# Patient Record
Sex: Male | Born: 1980 | Race: White | Hispanic: No | Marital: Married | Smoking: Never smoker
Health system: Southern US, Community
[De-identification: ages and names within clinical notes are randomized; demographics above are authoritative.]

## PROBLEM LIST (undated history)

## (undated) DIAGNOSIS — B019 Varicella without complication: Secondary | ICD-10-CM

## (undated) HISTORY — DX: Varicella without complication: B01.9

---

## 2012-08-08 HISTORY — PX: CERVICAL FUSION: SHX112

## 2014-05-26 ENCOUNTER — Ambulatory Visit: Payer: Self-pay | Admitting: Orthopedic Surgery

## 2014-11-14 ENCOUNTER — Ambulatory Visit
Admit: 2014-11-14 | Disposition: A | Discharge status: Home / Self Care | Payer: Self-pay | Attending: Neurosurgery | Admitting: Neurosurgery

## 2016-01-06 ENCOUNTER — Ambulatory Visit (INDEPENDENT_AMBULATORY_CARE_PROVIDER_SITE_OTHER): Payer: 59 | Admitting: Family Medicine

## 2016-01-06 ENCOUNTER — Ambulatory Visit (INDEPENDENT_AMBULATORY_CARE_PROVIDER_SITE_OTHER)
Admission: RE | Admit: 2016-01-06 | Discharge: 2016-01-06 | Disposition: A | Discharge status: Home / Self Care | Payer: 59 | Source: Ambulatory Visit | Attending: Family Medicine | Admitting: Family Medicine

## 2016-01-06 ENCOUNTER — Encounter: Payer: Self-pay | Admitting: Family Medicine

## 2016-01-06 VITALS — BP 104/76 | HR 66 | Temp 98.3°F | Ht 66.25 in | Wt 175.1 lb

## 2016-01-06 DIAGNOSIS — R208 Other disturbances of skin sensation: Secondary | ICD-10-CM

## 2016-01-06 DIAGNOSIS — M546 Pain in thoracic spine: Secondary | ICD-10-CM | POA: Insufficient documentation

## 2016-01-06 DIAGNOSIS — L659 Nonscarring hair loss, unspecified: Secondary | ICD-10-CM | POA: Diagnosis not present

## 2016-01-06 DIAGNOSIS — R200 Anesthesia of skin: Secondary | ICD-10-CM

## 2016-01-06 MED ORDER — DICLOFENAC SODIUM 75 MG PO TBEC: 75.0000 mg | DELAYED_RELEASE_TABLET | Freq: Two times a day (BID) | ORAL | Status: AC

## 2016-01-06 NOTE — Assessment & Plan Note (Signed)
New problem. Likely MSK in etiology. Obtaining xray.  Treating with Voltaren.

## 2016-01-06 NOTE — Assessment & Plan Note (Signed)
New problem. Alopecia barbae. Sending to derm for treatment.

## 2016-01-06 NOTE — Patient Instructions (Signed)
Take the medication as needed for your pain.  We will call with the xray results as well as with the referral to dermatology.  Follow up annually or sooner if needed (i.e if pain persists).  Wouldn't be a bad idea to see the surgeon.  Take care  Dr. Adriana Simasook

## 2016-01-06 NOTE — Progress Notes (Signed)
Pre visit review using our clinic review tool, if applicable. No additional management support is needed unless otherwise documented below in the visit note. 

## 2016-01-06 NOTE — Progress Notes (Signed)
Subjective:  Patient ID: Greg Doyle, male    DOB: Dec 08, 1980  Age: 35 y.o. MRN: [id number]  CC: Hair loss, beard; Back pain  HPI Greg Doyle is a 35 y.o. male presents to the clinic today with the above complaints.  Hair loss  Patient reports approximately 3 months ago he had a single circular area of hair loss is beard.  He states that since that time he's had an additional area pop up.  He states that he is concerned that this is worsening or spreading.  No associated itching, redness, irritation.  No new stressors or exposures.  No known exacerbating or relieving factors.  Back pain  Patient reports the past 3-4 months she's had a discrete area of back pain.  He states it is been worsening over the past 3-4 days.  Located in the mid back.  Pain is directly over the spinous process.  Worsens with certain activities.  No known relieving factors.  No medications or interventions tried.  He reports that he's had numbness of his index finger which he correlates with this back pain. Of note, he has had cervical fusion.  No recent fall, trauma, injury.  PMH, Surgical Hx, Family Hx, Social History reviewed and updated as below.  Past Medical History  Diagnosis Date  . Chicken pox    Past Surgical History  Procedure Laterality Date  . Cervical fusion  2014    C6-C7   History reviewed. No pertinent family history. Social History  Substance Use Topics  . Smoking status: Never Smoker   . Smokeless tobacco: Never Used  . Alcohol Use: 0.0 - 0.6 oz/week    0-1 Standard drinks or equivalent per week   Review of Systems  Musculoskeletal: Positive for back pain.  Skin:       Hair loss - beard.  Neurological:       Index finger (R) numbness.  All other systems reviewed and are negative.  Objective:   Today's Vitals: BP 104/76 mmHg  Pulse 66  Temp(Src) 98.3 F (36.8 C) (Oral)  Ht 5' 6.25" (1.683 m)  Wt 175 lb 2 oz (79.436 kg)  BMI 28.04  kg/m2  SpO2 98%  Physical Exam  Constitutional: He is oriented to person, place, and time. He appears well-developed. No distress.  HENT:  Head: Normocephalic and atraumatic.  Mouth/Throat: Oropharynx is clear and moist.  Eyes: Conjunctivae are normal. No scleral icterus.  Neck: Neck supple.  Cardiovascular: Normal rate and regular rhythm.   Pulmonary/Chest: Effort normal and breath sounds normal.  Abdominal: Soft. He exhibits no distension. There is no tenderness. There is no rebound and no guarding.  Musculoskeletal:  Thoracic spine - discrete area of tenderness over the spinous process of T7.  Lymphadenopathy:    He has no cervical adenopathy.  Neurological: He is alert and oriented to person, place, and time.  Skin:  Beard with 2 focal areas of hair loss (3.5 cm x 3 cm & 1.5 cm circumferentially).  Psychiatric:  Flat affect.  Vitals reviewed.  Assessment & Plan:   Problem List Items Addressed This Visit    Midline thoracic back pain    New problem. Likely MSK in etiology. Obtaining xray.  Treating with Voltaren.      Relevant Medications   diclofenac (VOLTAREN) 75 MG EC tablet   Other Relevant Orders   DG Thoracic Spine 2 View   Alopecia - Primary    New problem. Alopecia barbae. Sending to derm for treatment.  Relevant Orders   Ambulatory referral to Dermatology    Other Visit Diagnoses    Hand numbness        Relevant Orders    DG Cervical Spine Complete       Outpatient Encounter Prescriptions as of 01/06/2016  Medication Sig  . diclofenac (VOLTAREN) 75 MG EC tablet Take 1 tablet (75 mg total) by mouth 2 (two) times daily.   No facility-administered encounter medications on file as of 01/06/2016.    Follow-up: PRN  Everlene OtherJayce Marli Diego DO [facility]

## 2016-01-08 ENCOUNTER — Telehealth: Payer: Self-pay | Admitting: *Deleted

## 2016-01-08 NOTE — Telephone Encounter (Signed)
Patient requested his Xray results

## 2016-01-08 NOTE — Telephone Encounter (Signed)
Informed patient of X-Ray results.

## 2017-11-17 ENCOUNTER — Other Ambulatory Visit: Payer: Self-pay | Admitting: Family Medicine

## 2018-01-02 IMAGING — DX DG CERVICAL SPINE COMPLETE 4+V
5 series · 5 of 5 positions shown · non-contrast
Comparison: November 14, 2014.

CLINICAL DATA: Right finger numbness and tingling.

EXAM:
CERVICAL SPINE - COMPLETE 4+ VIEW

[c-spine lat]
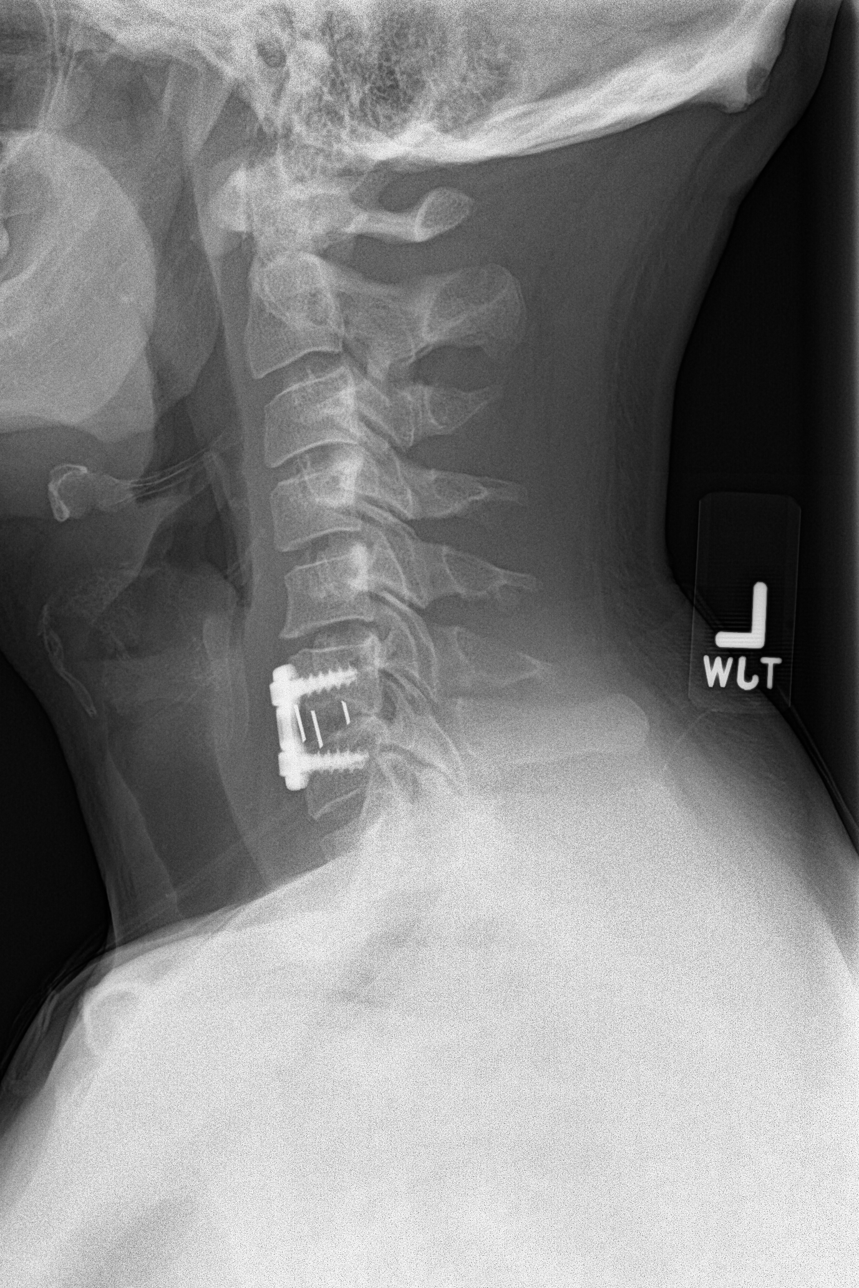

[c-spine obl (1 of 2)]
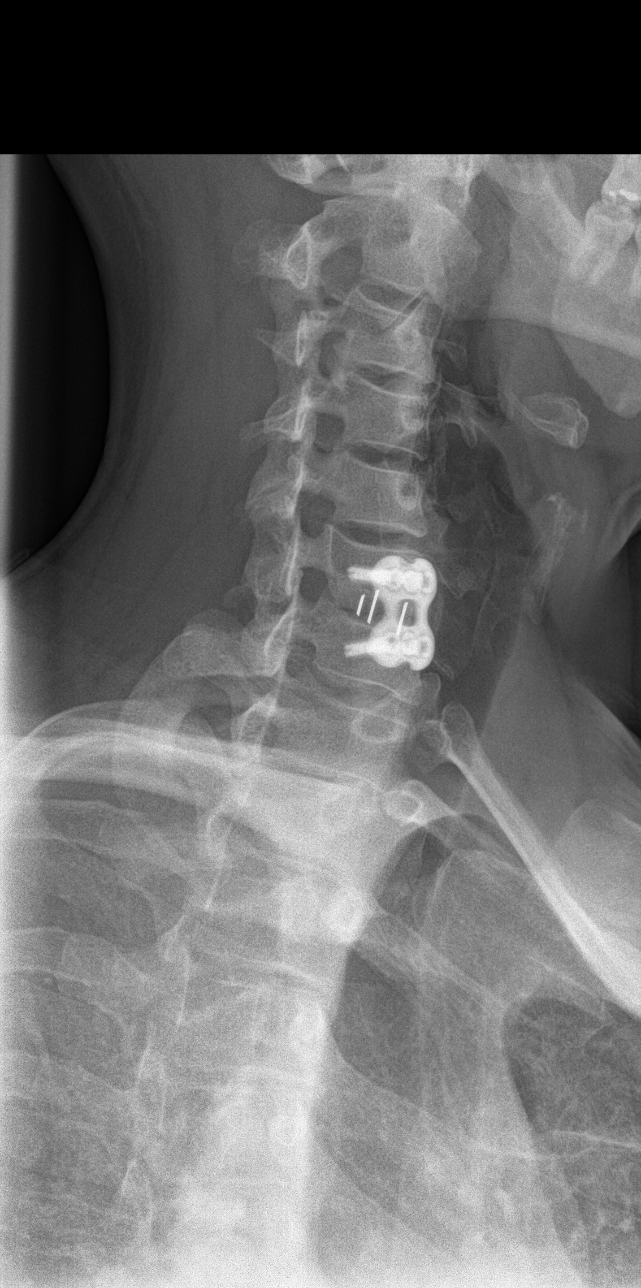

[c-spine obl (2 of 2)]
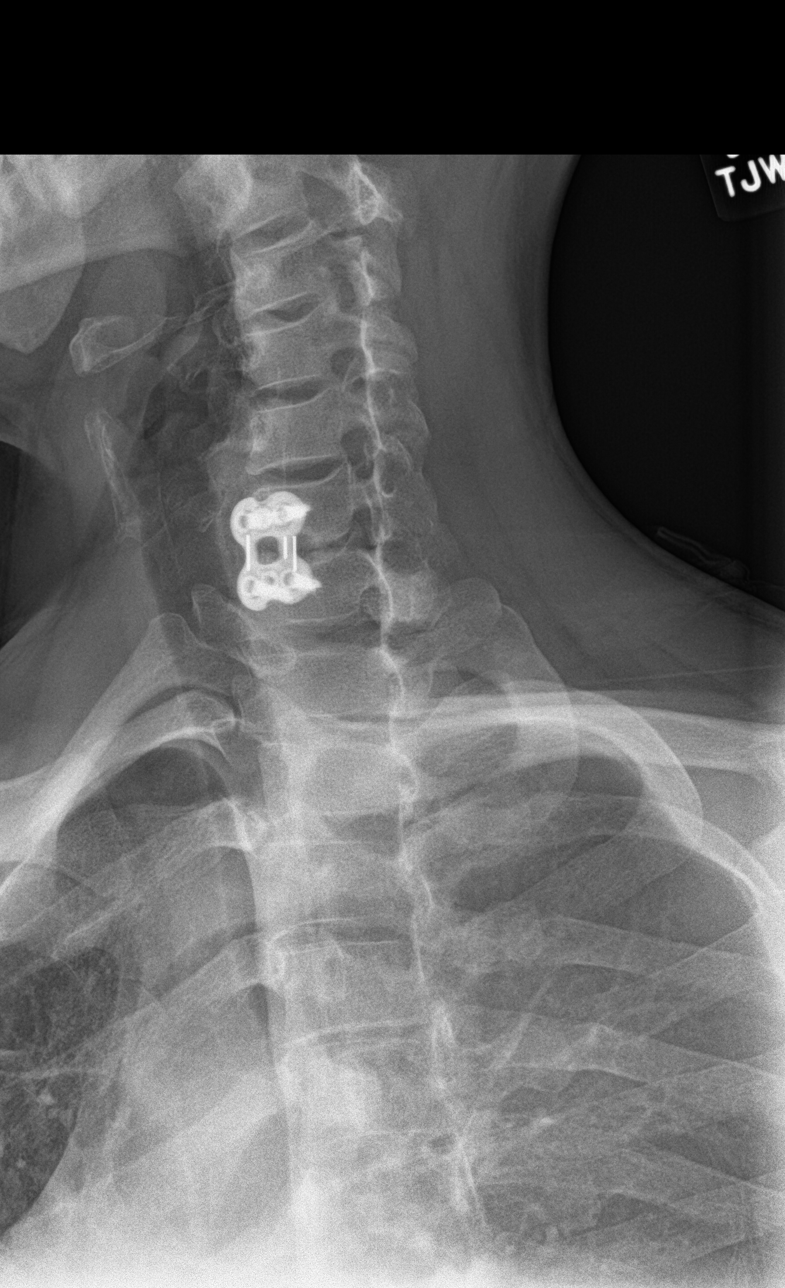

[c-spine ap]
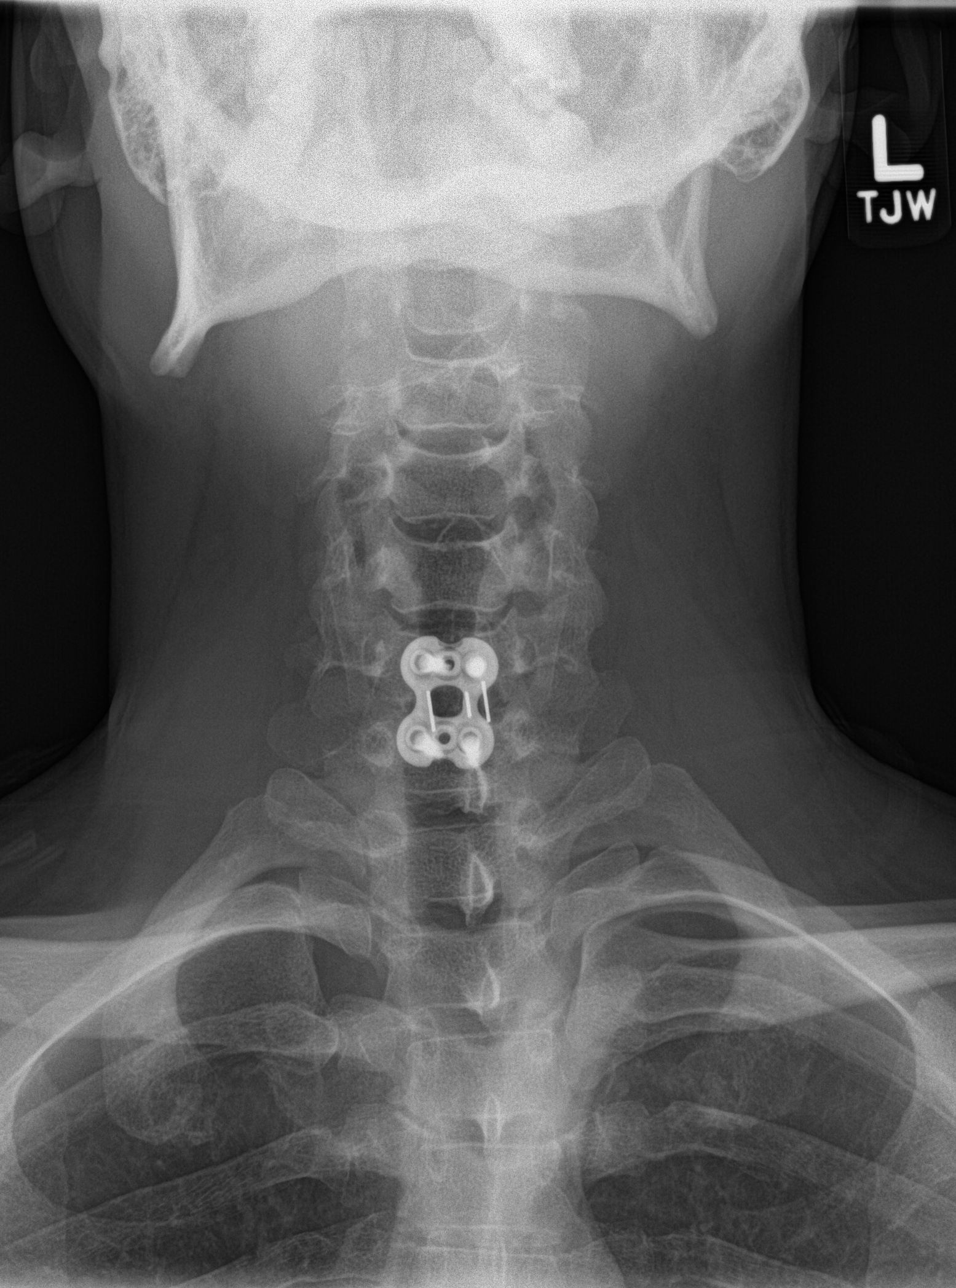

[c-spine open mouth]
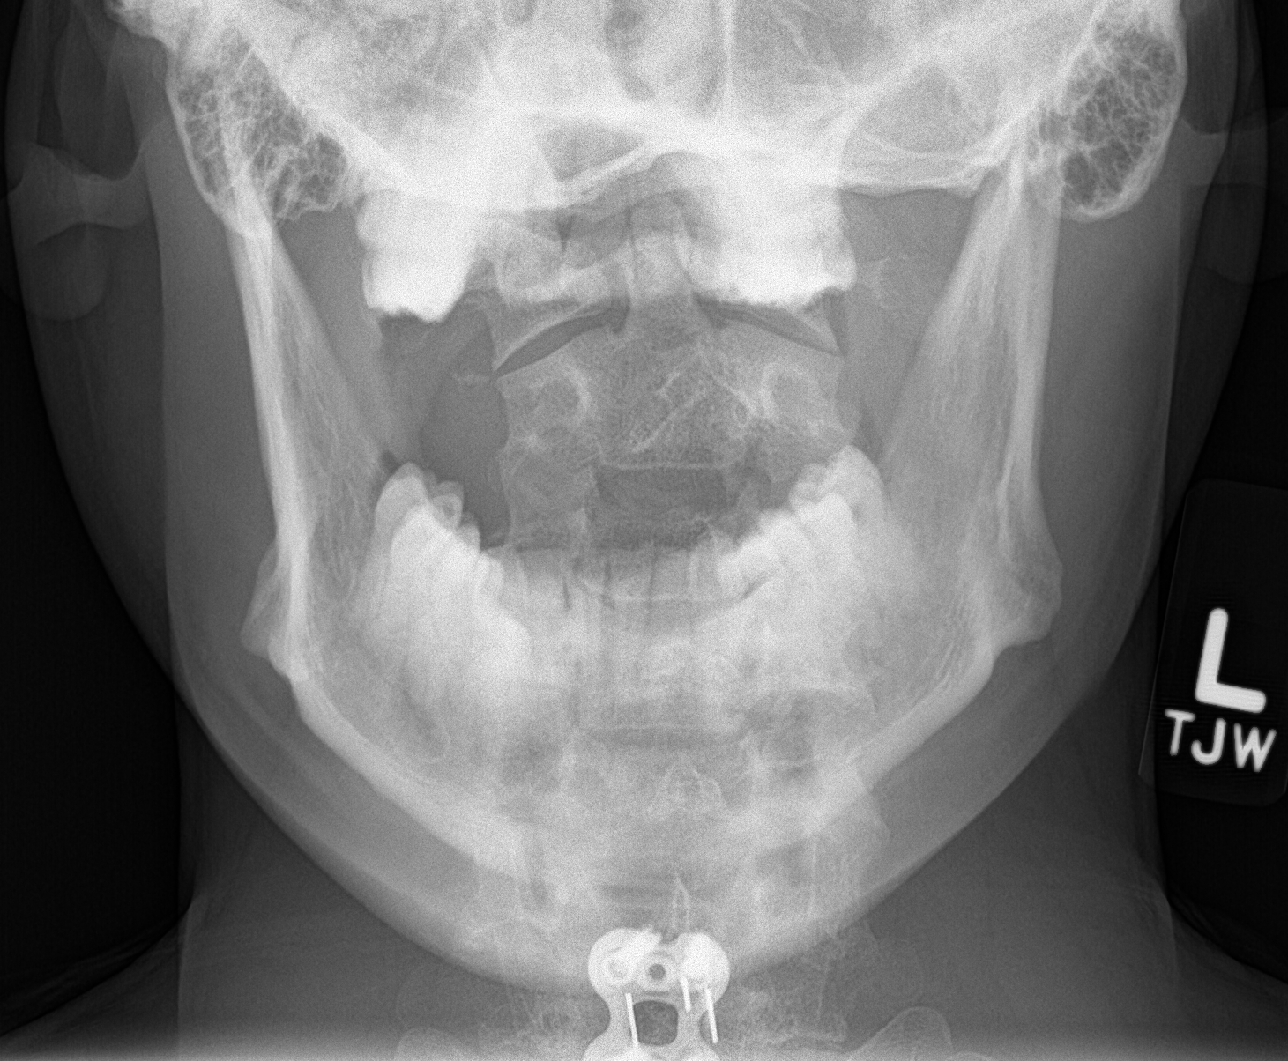

[5 of 5 positions shown; findings below may reference images not displayed]

FINDINGS: Status post surgical anterior fusion of C6-7. No fracture or
spondylolisthesis is noted. Mild neural foraminal stenosis is noted
on the right at C3-4 secondary to uncovertebral spurring. No
significant neural foraminal stenosis is noted on the left.
Remaining disc spaces appear intact.
IMPRESSION: Status post surgical anterior fusion of C6-7. Mild right-sided
neural foraminal stenosis is noted at C3-4 secondary to
uncovertebral spurring.
